# Patient Record
Sex: Female | Born: 1990 | Race: White | Hispanic: No | Marital: Single | Smoking: Current every day smoker
Health system: Southern US, Community
[De-identification: ages and names within clinical notes are randomized; demographics above are authoritative.]

## PROBLEM LIST (undated history)

## (undated) HISTORY — PX: OTHER SURGICAL HISTORY: SHX169

---
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
## 2012-09-11 ENCOUNTER — Encounter (HOSPITAL_COMMUNITY): Payer: Self-pay | Admitting: *Deleted

## 2012-09-11 ENCOUNTER — Emergency Department (HOSPITAL_COMMUNITY): Payer: BC Managed Care – PPO

## 2012-09-11 ENCOUNTER — Emergency Department (HOSPITAL_COMMUNITY)
Admission: EM | Admit: 2012-09-11 | Discharge: 2012-09-12 | Disposition: A | Discharge status: Home / Self Care | Payer: BC Managed Care – PPO | Attending: Emergency Medicine | Admitting: Emergency Medicine

## 2012-09-11 DIAGNOSIS — O039 Complete or unspecified spontaneous abortion without complication: Secondary | ICD-10-CM | POA: Insufficient documentation

## 2012-09-11 DIAGNOSIS — R109 Unspecified abdominal pain: Secondary | ICD-10-CM | POA: Insufficient documentation

## 2012-09-11 DIAGNOSIS — F17200 Nicotine dependence, unspecified, uncomplicated: Secondary | ICD-10-CM | POA: Insufficient documentation

## 2012-09-11 LAB — URINALYSIS, ROUTINE W REFLEX MICROSCOPIC
Glucose, UA: NEGATIVE mg/dL
Ketones, ur: 15 mg/dL — AB
Nitrite: NEGATIVE
Protein, ur: 30 mg/dL — AB
Specific Gravity, Urine: 1.03 (ref 1.005–1.030)
Urobilinogen, UA: 1 mg/dL (ref 0.0–1.0)
pH: 7 (ref 5.0–8.0)

## 2012-09-11 LAB — CBC
HCT: 38.3 % (ref 36.0–46.0)
Hemoglobin: 13.4 g/dL (ref 12.0–15.0)
MCH: 28.3 pg (ref 26.0–34.0)
MCHC: 35 g/dL (ref 30.0–36.0)
MCV: 81 fL (ref 78.0–100.0)
Platelets: 190 10*3/uL (ref 150–400)
RBC: 4.73 MIL/uL (ref 3.87–5.11)
RDW: 14 % (ref 11.5–15.5)
WBC: 14.6 10*3/uL — ABNORMAL HIGH (ref 4.0–10.5)

## 2012-09-11 LAB — ABO/RH
ABO/RH(D): O NEG
Antibody Screen: NEGATIVE

## 2012-09-11 LAB — URINE MICROSCOPIC-ADD ON

## 2012-09-11 LAB — POCT PREGNANCY, URINE: Preg Test, Ur: POSITIVE — AB

## 2012-09-11 LAB — HCG, QUANTITATIVE, PREGNANCY: hCG, Beta Chain, Quant, S: 799 m[IU]/mL — ABNORMAL HIGH (ref <5)

## 2012-09-11 MED ORDER — RHO D IMMUNE GLOBULIN 1500 UNIT/2ML IJ SOLN
300.0000 ug | Freq: Once | INTRAMUSCULAR | Status: AC
  Administered 2012-09-11: 300 ug via INTRAMUSCULAR

## 2012-09-11 NOTE — ED Notes (Signed)
Lab reports pt is [redacted] weeks pregnant, RH neg, O neg.

## 2012-09-11 NOTE — ED Notes (Signed)
The pt has had a pos preg test of approx [redacted] weeks pregnant. She has had vaginal bleeding since yesterday today her flow is heavier.   July 4th lmp

## 2012-09-11 NOTE — ED Notes (Signed)
Mild abd cramps

## 2012-09-12 LAB — RH IG WORKUP (INCLUDES ABO/RH)
ABO/RH(D): O NEG
Antibody Screen: NEGATIVE
Gestational Age(Wks): 6
Unit division: 0
Unit tag comment: 6

## 2012-09-12 NOTE — Discharge Instructions (Signed)
Return to the emergency department if you develop abdominal pain.  Miscarriage A miscarriage is the sudden loss of an unborn baby (fetus) before the 20th week of pregnancy. Most miscarriages happen in the first 3 months of pregnancy. Sometimes, it happens before a woman even knows she is pregnant. A miscarriage is also called a "spontaneous miscarriage" or "early pregnancy loss." Having a miscarriage can be an emotional experience. Talk with your caregiver about any questions you may have about miscarrying, the grieving process, and your future pregnancy plans. CAUSES   Problems with the fetal chromosomes that make it impossible for the baby to develop normally. Problems with the baby's genes or chromosomes are most often the result of errors that occur, by chance, as the embryo divides and grows. The problems are not inherited from the parents.  Infection of the cervix or uterus.   Hormone problems.   Problems with the cervix, such as having an incompetent cervix. This is when the tissue in the cervix is not strong enough to hold the pregnancy.   Problems with the uterus, such as an abnormally shaped uterus, uterine fibroids, or congenital abnormalities.   Certain medical conditions.   Smoking, drinking alcohol, or taking illegal drugs.   Trauma.  Often, the cause of a miscarriage is unknown.  SYMPTOMS   Vaginal bleeding or spotting, with or without cramps or pain.  Pain or cramping in the abdomen or lower back.  Passing fluid, tissue, or blood clots from the vagina. DIAGNOSIS  Your caregiver will perform a physical exam. You may also have an ultrasound to confirm the miscarriage. Blood or urine tests may also be ordered. TREATMENT   Sometimes, treatment is not necessary if you naturally pass all the fetal tissue that was in the uterus. If some of the fetus or placenta remains in the body (incomplete miscarriage), tissue left behind may become infected and must be removed.  Usually, a dilation and curettage (D and C) procedure is performed. During a D and C procedure, the cervix is widened (dilated) and any remaining fetal or placental tissue is gently removed from the uterus.  Antibiotic medicines are prescribed if there is an infection. Other medicines may be given to reduce the size of the uterus (contract) if there is a lot of bleeding.  If you have Rh negative blood and your baby was Rh positive, you will need a Rh immunoglobulin shot. This shot will protect any future baby from having Rh blood problems in future pregnancies. HOME CARE INSTRUCTIONS   Your caregiver may order bed rest or may allow you to continue light activity. Resume activity as directed by your caregiver.  Have someone help with home and family responsibilities during this time.   Keep track of the number of sanitary pads you use each day and how soaked (saturated) they are. Write down this information.   Do not use tampons. Do not douche or have sexual intercourse until approved by your caregiver.   Only take over-the-counter or prescription medicines for pain or discomfort as directed by your caregiver.   Do not take aspirin. Aspirin can cause bleeding.   Keep all follow-up appointments with your caregiver.   If you or your partner have problems with grieving, talk to your caregiver or seek counseling to help cope with the pregnancy loss. Allow enough time to grieve before trying to get pregnant again.  SEEK IMMEDIATE MEDICAL CARE IF:   You have severe cramps or pain in your back or abdomen.  You have a fever.  You pass large blood clots (walnut-sized or larger) ortissue from your vagina. Save any tissue for your caregiver to inspect.   Your bleeding increases.   You have a thick, bad-smelling vaginal discharge.  You become lightheaded, weak, or you faint.   You have chills.  MAKE SURE YOU:  Understand these instructions.  Will watch your condition.  Will  get help right away if you are not doing well or get worse. Document Released: 07/09/2000 Document Revised: 07/15/2011 Document Reviewed: 03/04/2011 [facility] Patient Information 2014 [place], [place].

## 2012-09-12 NOTE — ED Provider Notes (Signed)
CSN: [id number]     Arrival date & time 09/11/12  1847 History     First MD Initiated Contact with Patient 09/11/12 1952     Chief Complaint  Patient presents with  . Vaginal Bleeding   (Consider location/radiation/quality/duration/timing/severity/associated sxs/prior Treatment) Patient is a 22 y.o. female presenting with vaginal bleeding.  Vaginal Bleeding Associated symptoms: abdominal pain   Associated symptoms: no back pain, no dizziness, no dysuria, no fever and no nausea    this is a G2 P62 female whose last menstrual period was July 4 who presents with vaginal bleeding. The patient states that she had a positive home pregnancy test that was confirmed by her doctor. She noted passage of bright red clots today. She's had some lower, cramping. She denies any syncope or dizziness. The patient is a O negative.  History reviewed. No pertinent past medical history. History reviewed. No pertinent past surgical history. No family history on file. History  Substance Use Topics  . Smoking status: Current Every Day Smoker  . Smokeless tobacco: Not on file  . Alcohol Use: No   OB History   Grav Para Term Preterm Abortions TAB SAB Ect Mult Living                 Review of Systems  Constitutional: Negative for fever.  Respiratory: Negative for shortness of breath.   Cardiovascular: Negative for chest pain.  Gastrointestinal: Positive for abdominal pain. Negative for nausea, vomiting and diarrhea.  Genitourinary: Positive for vaginal bleeding. Negative for dysuria.  Musculoskeletal: Negative for back pain.  Neurological: Negative for dizziness.  Psychiatric/Behavioral: Negative for agitation.  All other systems reviewed and are negative.    Allergies  Review of patient's allergies indicates no known allergies.  Home Medications  No current outpatient prescriptions on file. BP 120/67  Pulse 98  Temp(Src) 98.2 F (36.8 C) (Oral)  Resp 16  SpO2 98%  LMP 07/30/2012 Physical  Exam  Nursing note and vitals reviewed. Constitutional: She is oriented to person, place, and time. She appears well-developed and well-nourished.  HENT:  Head: Normocephalic and atraumatic.  Eyes: Pupils are equal, round, and reactive to light.  Neck: Neck supple.  Cardiovascular: Normal rate, regular rhythm and normal heart sounds.   Pulmonary/Chest: Effort normal. No respiratory distress. She has no wheezes.  Abdominal: Soft. Bowel sounds are normal.  Genitourinary:  Blood pooling in the vaginal vault, no clot noted, cervical os is closed.  Neurological: She is alert and oriented to person, place, and time.  Skin: Skin is warm and dry.  Psychiatric: She has a normal mood and affect.    ED Course   Procedures (including critical care time)  Labs Reviewed  CBC - Abnormal; Notable for the following:    WBC 14.6 (*)    All other components within normal limits  URINALYSIS, ROUTINE W REFLEX MICROSCOPIC - Abnormal; Notable for the following:    Color, Urine AMBER (*)    APPearance CLOUDY (*)    Hgb urine dipstick LARGE (*)    Bilirubin Urine SMALL (*)    Ketones, ur 15 (*)    Protein, ur 30 (*)    Leukocytes, UA SMALL (*)    All other components within normal limits  URINE MICROSCOPIC-ADD ON - Abnormal; Notable for the following:    Squamous Epithelial / LPF FEW (*)    All other components within normal limits  HCG, QUANTITATIVE, PREGNANCY - Abnormal; Notable for the following:    hCG, Beta Chain, Quant,  S 799 (*)    All other components within normal limits  POCT PREGNANCY, URINE - Abnormal; Notable for the following:    Preg Test, Ur POSITIVE (*)    All other components within normal limits  ABO/RH  RH IG WORKUP (INCLUDES ABO/RH)   No results found. No diagnosis found. 1.  Bleeding duringearly pregnancy Cannot r/o ectopic pregnancy, completed or threatened misscarriage  MDM  This is a 22 yo who presents with vaginal bleeding in the setting of early pregnancy.   Patient is nontoxic.  Labwork including type and screen were obtained.  Patient does not have any lateralizing abdominal pain and pelvic is notable for pool blood and a closed os.  Patient is O- and will need rhogam.  b HCG is 799 and ultrasound shows no gestational sac.  THis could represent an ectopic pregnancy, a completed miscarriage, or early pregnancy.  Patient will need a repeat b hcg and ultrasound in two weeks.  She was given strict return precautions if she has worsening abdominal pain, worsening bleeding, lightheadedness.  Shon Baton, MD 09/12/12 [phone]

## 2012-09-12 NOTE — ED Provider Notes (Signed)
Patient initially seen and examined by Dr. Wilkie Aye and sent for ultrasound for suspected completed moderate miscarriage. Ultrasound shows no evidence of any intrauterine pregnancy but no free fluid to suggest ruptured ectopic pregnancy. She most likely has had a completed miscarriage. She is discharged and referred to GYN outpatient clinic for followup.  Dione Booze, MD 09/12/12 0230

## 2014-04-08 IMAGING — US US OB TRANSVAGINAL
1 series · 13 of 28 positions shown · non-contrast
Comparison: None.

CLINICAL DATA: 21-year-old female with vaginal bleeding.  Positive
pregnancy test.  Quantitative beta HCG 799.  Estimated gestational
age by LMP 6 weeks 2 days.

OBSTETRIC <14 WK US AND TRANSVAGINAL OB US
TECHNIQUE: Both transabdominal and transvaginal ultrasound
examinations were performed for complete evaluation of the
gestation as well as the maternal uterus, adnexal regions, and
pelvic cul-de-sac.  Transvaginal technique was performed to assess
early pregnancy.

[Series 1: us ob transvaginal · 0.26mm/px · 13 of 53 slices shown]
[im 2/53]
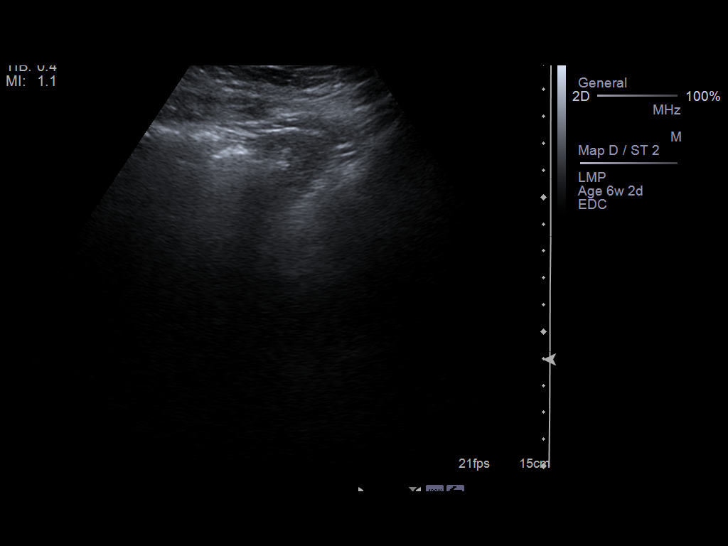
[im 6/53]
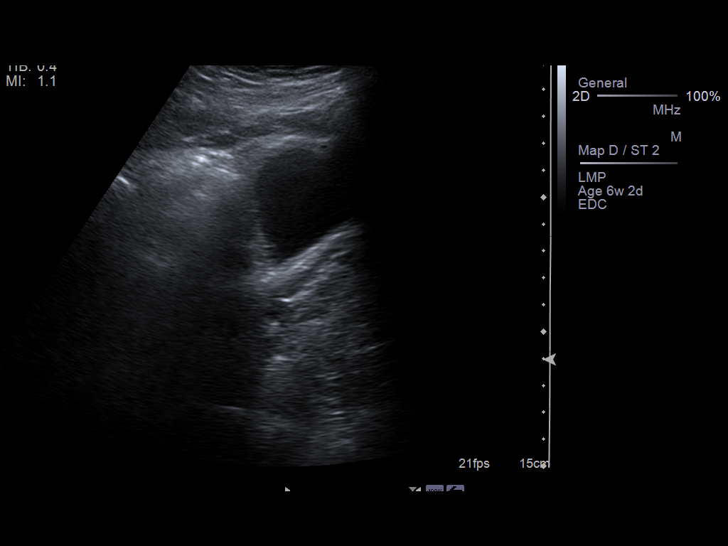
[im 10/53]
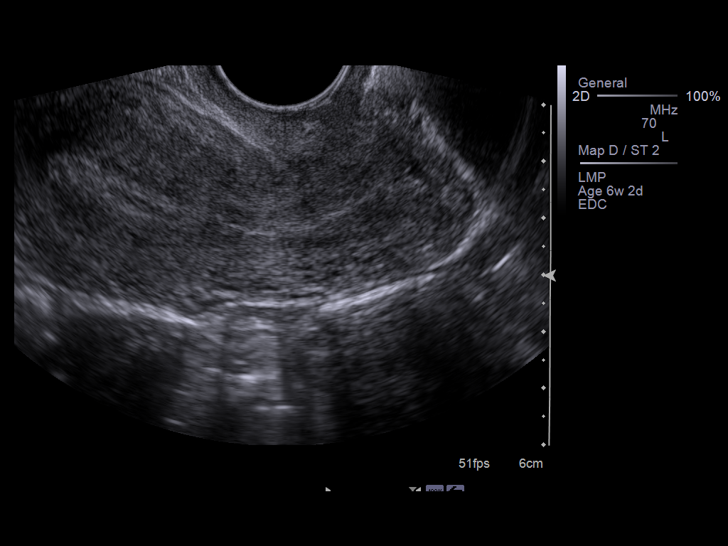
[im 14/53]
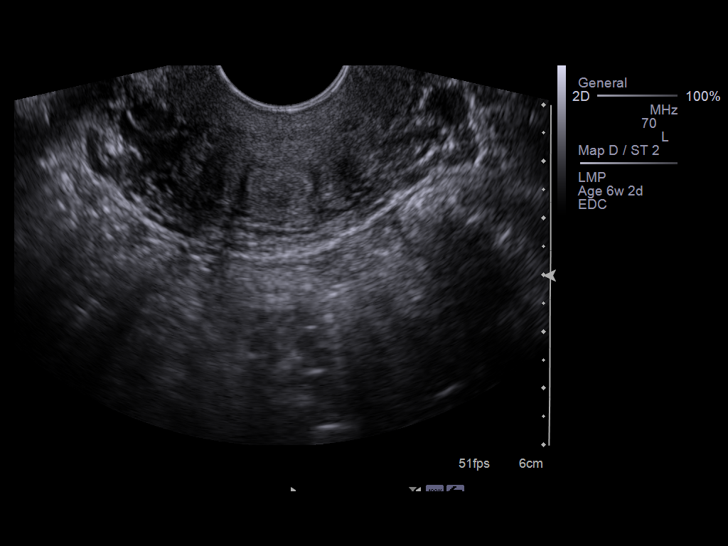
[im 18/53]
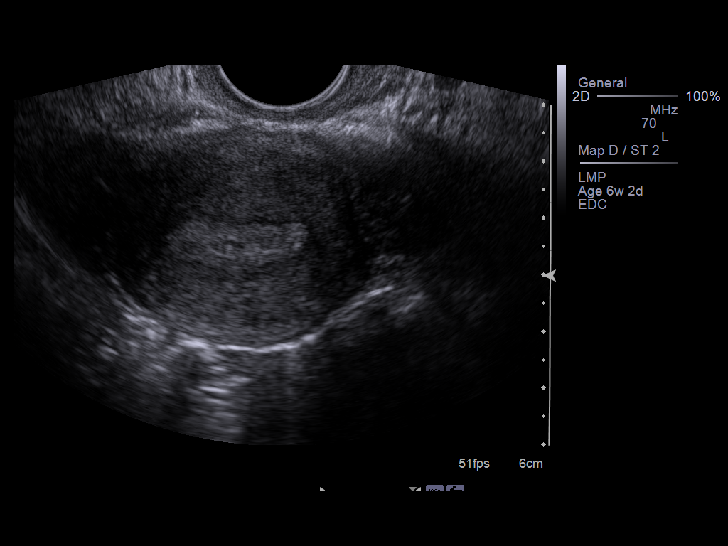
[im 22/53]
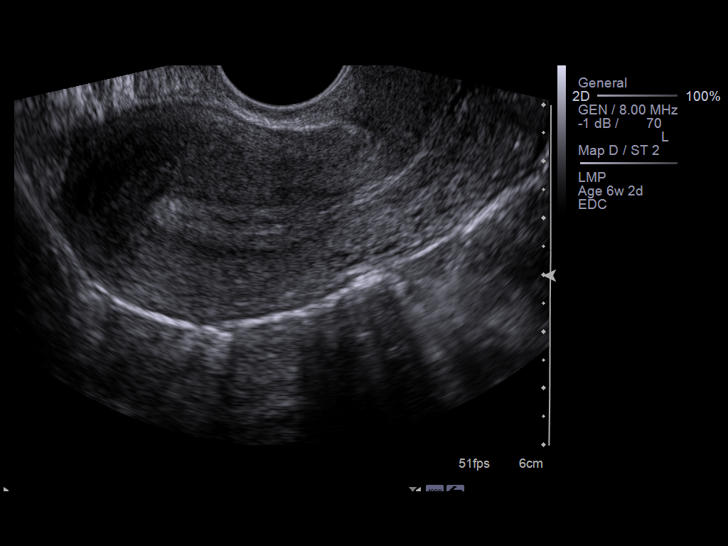
[im 27/53]
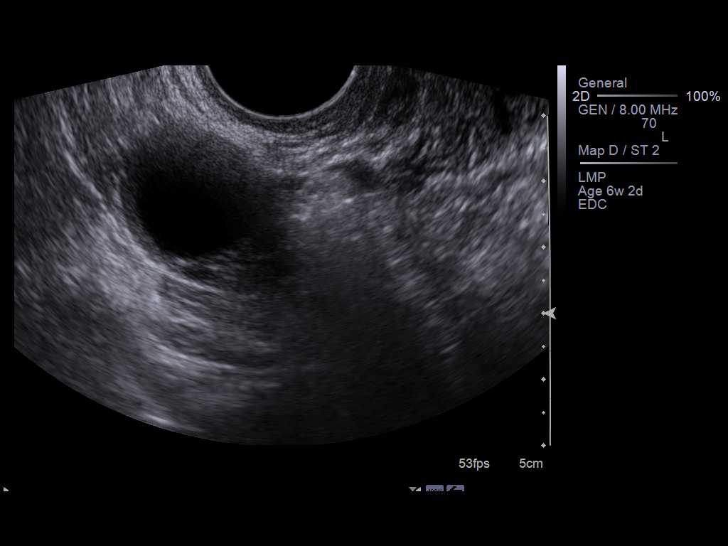
[im 31/53]
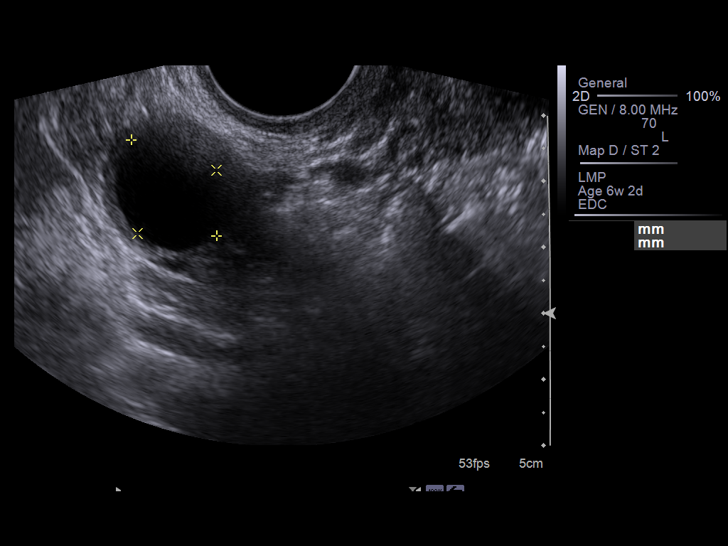
[im 35/53]
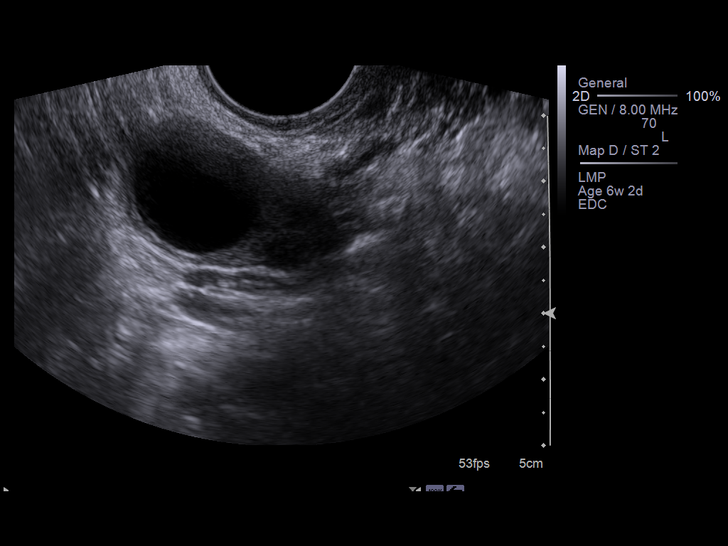
[im 39/53]
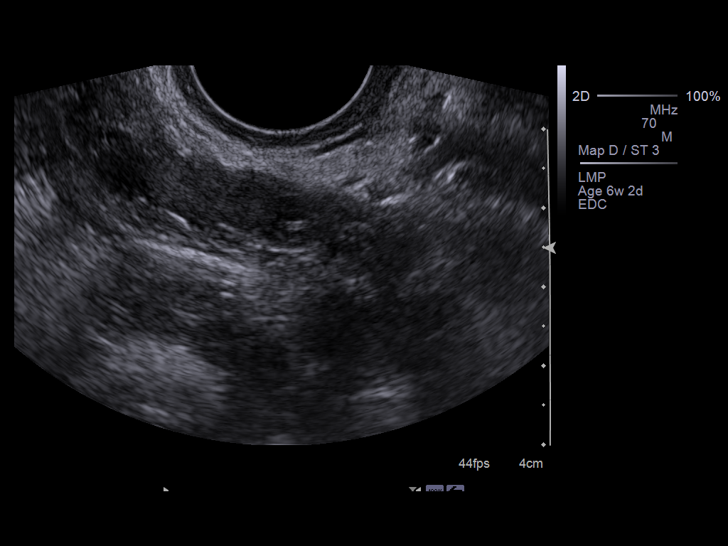
[im 43/53]
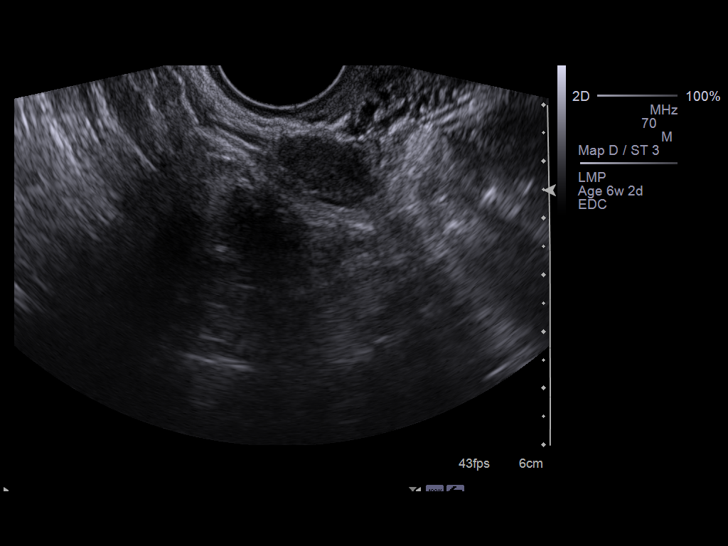
[im 47/53]
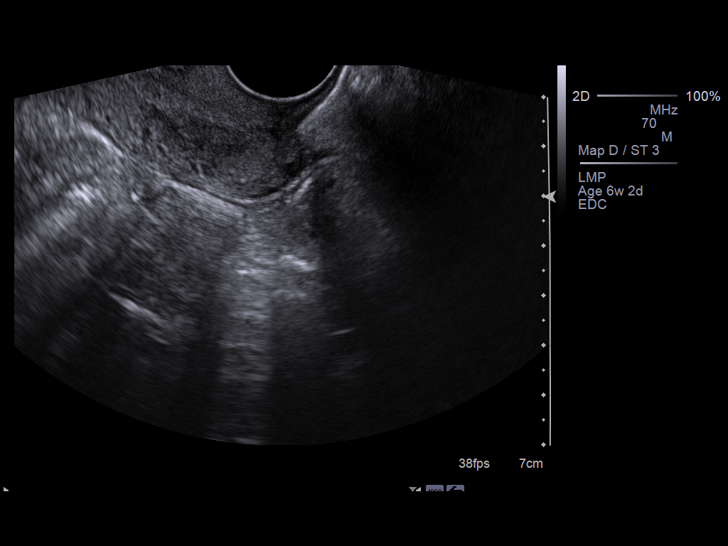
[im 51/53]
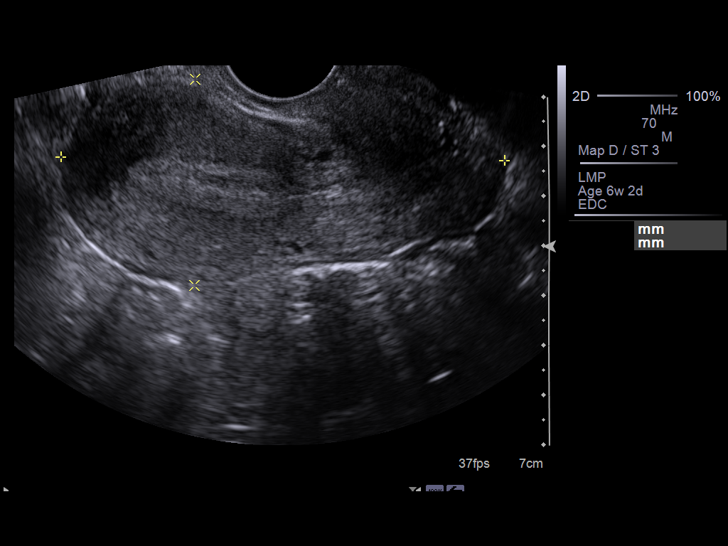

[13 of 28 positions shown; findings below may reference images not displayed]

Intrauterine gestational sac:  None.

Maternal uterus/adnexae:
Bland uterus and endometrium.

Right ovary measures 3.2 x 2.2 x 2.8 cm and contains a simple
appearing cystic structure measuring 19 mm diameter.  No internal
architecture or hypervascularity (image 35).  Normal left ovary
measuring 20 x 19 x 9 mm.

No pelvic free fluid.
IMPRESSION: No intrauterine gestational sac, yolk sac, fetal pole, or cardiac
activity visualized.  Simple appearing right adnexal cyst.  No
pelvic free fluid or suspicious adnexal mass.
Differential considerations include intrauterine gestation too
early to be sonographically visualized, spontaneous abortion, or
ectopic pregnancy.  Consider follow-up ultrasound in 14 days and
serial quantitative beta HCG follow-up.

## 2018-06-24 ENCOUNTER — Inpatient Hospital Stay (HOSPITAL_COMMUNITY): Payer: Medicaid Other

## 2018-06-24 ENCOUNTER — Inpatient Hospital Stay (HOSPITAL_COMMUNITY)
Admission: AD | Admit: 2018-06-24 | Discharge: 2018-06-24 | Disposition: A | Discharge status: Home / Self Care | Payer: Medicaid Other | Attending: Obstetrics and Gynecology | Admitting: Obstetrics and Gynecology

## 2018-06-24 ENCOUNTER — Encounter (HOSPITAL_COMMUNITY): Payer: Self-pay | Admitting: *Deleted

## 2018-06-24 ENCOUNTER — Other Ambulatory Visit: Payer: Self-pay

## 2018-06-24 DIAGNOSIS — O23591 Infection of other part of genital tract in pregnancy, first trimester: Secondary | ICD-10-CM | POA: Diagnosis not present

## 2018-06-24 DIAGNOSIS — N760 Acute vaginitis: Secondary | ICD-10-CM | POA: Diagnosis not present

## 2018-06-24 DIAGNOSIS — F17200 Nicotine dependence, unspecified, uncomplicated: Secondary | ICD-10-CM | POA: Diagnosis not present

## 2018-06-24 DIAGNOSIS — O209 Hemorrhage in early pregnancy, unspecified: Secondary | ICD-10-CM | POA: Insufficient documentation

## 2018-06-24 DIAGNOSIS — Z3A10 10 weeks gestation of pregnancy: Secondary | ICD-10-CM | POA: Insufficient documentation

## 2018-06-24 DIAGNOSIS — O4691 Antepartum hemorrhage, unspecified, first trimester: Secondary | ICD-10-CM

## 2018-06-24 DIAGNOSIS — O99331 Smoking (tobacco) complicating pregnancy, first trimester: Secondary | ICD-10-CM | POA: Insufficient documentation

## 2018-06-24 DIAGNOSIS — O4690 Antepartum hemorrhage, unspecified, unspecified trimester: Secondary | ICD-10-CM

## 2018-06-24 DIAGNOSIS — B9689 Other specified bacterial agents as the cause of diseases classified elsewhere: Secondary | ICD-10-CM | POA: Insufficient documentation

## 2018-06-24 DIAGNOSIS — N939 Abnormal uterine and vaginal bleeding, unspecified: Secondary | ICD-10-CM | POA: Diagnosis present

## 2018-06-24 LAB — WET PREP, GENITAL
Sperm: NONE SEEN
Trich, Wet Prep: NONE SEEN
Yeast Wet Prep HPF POC: NONE SEEN

## 2018-06-24 LAB — URINALYSIS, ROUTINE W REFLEX MICROSCOPIC
Bacteria, UA: NONE SEEN
Bilirubin Urine: NEGATIVE
Glucose, UA: NEGATIVE mg/dL
Ketones, ur: NEGATIVE mg/dL
Leukocytes,Ua: NEGATIVE
Nitrite: NEGATIVE
Protein, ur: NEGATIVE mg/dL
Specific Gravity, Urine: 1.008 (ref 1.005–1.030)
pH: 6 (ref 5.0–8.0)

## 2018-06-24 LAB — CBC
HCT: 34.6 % — ABNORMAL LOW (ref 36.0–46.0)
Hemoglobin: 10.7 g/dL — ABNORMAL LOW (ref 12.0–15.0)
MCH: 23.9 pg — ABNORMAL LOW (ref 26.0–34.0)
MCHC: 30.9 g/dL (ref 30.0–36.0)
MCV: 77.2 fL — ABNORMAL LOW (ref 80.0–100.0)
Platelets: 223 10*3/uL (ref 150–400)
RBC: 4.48 MIL/uL (ref 3.87–5.11)
RDW: 17.1 % — ABNORMAL HIGH (ref 11.5–15.5)
WBC: 12.3 10*3/uL — ABNORMAL HIGH (ref 4.0–10.5)
nRBC: 0 % (ref 0.0–0.2)

## 2018-06-24 LAB — HCG, QUANTITATIVE, PREGNANCY: hCG, Beta Chain, Quant, S: 1356 m[IU]/mL — ABNORMAL HIGH (ref <5)

## 2018-06-24 LAB — POCT PREGNANCY, URINE: Preg Test, Ur: POSITIVE — AB

## 2018-06-24 MED ORDER — METRONIDAZOLE 500 MG PO TABS: 500.0000 mg | ORAL_TABLET | Freq: Two times a day (BID) | ORAL | 0 refills | Status: AC

## 2018-06-24 MED ORDER — RHO D IMMUNE GLOBULIN 1500 UNIT/2ML IJ SOSY
300.0000 ug | PREFILLED_SYRINGE | Freq: Once | INTRAMUSCULAR | Status: AC
  Administered 2018-06-24: 300 ug via INTRAMUSCULAR
  Filled 2018-06-24: qty 2

## 2018-06-24 NOTE — MAU Note (Signed)
Maria English is a 28 y.o. at Unknown here in MAU reporting: vaginal bleeding that started this morning. Last intercourse 3 weeks ago.  LMP: 04/10/18 Onset of complaint: early AM Pain score: 0 Vitals:   06/24/18 1643  BP: 123/78  Pulse: (!) 103  Resp: 16  Temp: 98.4 F (36.9 C)  SpO2: 100%      Lab orders placed from triage: UA

## 2018-06-24 NOTE — MAU Provider Note (Signed)
History     CSN: [id number]  Arrival date and time: 06/24/18 1617   First Provider Initiated Contact with Patient 06/24/18 1705      Chief Complaint  Patient presents with  . Vaginal Bleeding   Maria English is a 28 y.o. [id number] at [redacted]w[redacted]d by Posey Rea LMP who receives care at [facility].  She presents today for Vaginal Bleeding that started this morning.  She denies passing of clots or abdominal cramping.  Patient states bleeding is similar to first day of period and the color is "really pink."  Patient denies need for pad usage, but does wear a pantyliner.  She endorses blood on arrival.  Patient endorses vaginal discharge that is "clear white," but denies itching, burning, or other concerns.      OB History    Gravida  9   Para  3   Term  3   Preterm      AB  5   Living  3     SAB  5   TAB      Ectopic      Multiple      Live Births  3           History reviewed. No pertinent past medical history.  Past Surgical History:  Procedure Laterality Date  . feet surgery      History reviewed. No pertinent family history.  Social History   Tobacco Use  . Smoking status: Current Every Day Smoker  . Smokeless tobacco: Current User  Substance Use Topics  . Alcohol use: No  . Drug use: Never    Allergies: No Known Allergies  Medications Prior to Admission  Medication Sig Dispense Refill Last Dose  . Prenatal Vit-Fe Fumarate-FA (PRENATAL MULTIVITAMIN) TABS tablet Take 1 tablet by mouth daily at 12 noon.   06/24/2018 at Unknown time    Review of Systems  Constitutional: Negative for chills and fever.  Gastrointestinal: Negative for abdominal pain, constipation, diarrhea, nausea and vomiting.  Genitourinary: Positive for vaginal bleeding and vaginal discharge. Negative for difficulty urinating, dysuria and pelvic pain.  Musculoskeletal: Positive for back pain.  Neurological: Negative for dizziness, light-headedness and headaches.   Physical Exam    Blood pressure 123/78, pulse (!) 103, temperature 98.4 F (36.9 C), resp. rate 16, last menstrual period 04/10/2018, SpO2 100 %.  Physical Exam  Constitutional: She is oriented to person, place, and time. She appears well-developed and well-nourished. No distress.  HENT:  Head: Normocephalic and atraumatic.  Eyes: Conjunctivae are normal.  Neck: Normal range of motion.  Cardiovascular: Normal rate.  Respiratory: Effort normal.  GI: Soft.  Genitourinary: Cervix exhibits discharge. Cervix exhibits no motion tenderness and no friability.    No vaginal discharge or bleeding.  No bleeding in the vagina.    Genitourinary Comments: Speculum Exam: -Vaginal Vault: Pink mucosa.  No apparent discharge. Wet prep collected from posterior fornix -Cervix: Pink, no lesions, cysts, or polyps.  Appears closed. Small amt active bleeding from os-Removed with 2 faux swabs.  GC/CT collected -Bimanual Exam: Closed/No Tenderness Uterus not c/w dates of 10 weeks.    Musculoskeletal: Normal range of motion.  Neurological: She is alert and oriented to person, place, and time.  Skin: Skin is warm and dry.  Psychiatric: She has a normal mood and affect. Her behavior is normal.    MAU Course  Procedures Results for orders placed or performed during the hospital encounter of 06/24/18 (from the past 24 hour(s))  Pregnancy, urine  POC     Status: Abnormal   Collection Time: 06/24/18  4:43 PM  Result Value Ref Range   Preg Test, Ur POSITIVE (A) NEGATIVE  Urinalysis, Routine w reflex microscopic     Status: Abnormal   Collection Time: 06/24/18  5:00 PM  Result Value Ref Range   Color, Urine YELLOW YELLOW   APPearance HAZY (A) CLEAR   Specific Gravity, Urine 1.008 1.005 - 1.030   pH 6.0 5.0 - 8.0   Glucose, UA NEGATIVE NEGATIVE mg/dL   Hgb urine dipstick MODERATE (A) NEGATIVE   Bilirubin Urine NEGATIVE NEGATIVE   Ketones, ur NEGATIVE NEGATIVE mg/dL   Protein, ur NEGATIVE NEGATIVE mg/dL   Nitrite NEGATIVE  NEGATIVE   Leukocytes,Ua NEGATIVE NEGATIVE   RBC / HPF 6-10 0 - 5 RBC/hpf   WBC, UA 0-5 0 - 5 WBC/hpf   Bacteria, UA NONE SEEN NONE SEEN   Squamous Epithelial / LPF 0-5 0 - 5   Mucus PRESENT   Wet prep, genital     Status: Abnormal   Collection Time: 06/24/18  5:12 PM  Result Value Ref Range   Yeast Wet Prep HPF POC NONE SEEN NONE SEEN   Trich, Wet Prep NONE SEEN NONE SEEN   Clue Cells Wet Prep HPF POC PRESENT (A) NONE SEEN   WBC, Wet Prep HPF POC MANY (A) NONE SEEN   Sperm NONE SEEN   Rh IG workup (includes ABO/Rh)     Status: None (Preliminary result)   Collection Time: 06/24/18  5:28 PM  Result Value Ref Range   Gestational Age(Wks) 10.5    ABO/RH(D) O NEG    Antibody Screen NEG    Unit Number [id number]    Blood Component Type RHIG    Unit division 00    Status of Unit ALLOCATED    Transfusion Status      OK TO TRANSFUSE Performed at [facility], [address] [place], [place] [address]   CBC     Status: Abnormal   Collection Time: 06/24/18  5:28 PM  Result Value Ref Range   WBC 12.3 (H) 4.0 - 10.5 K/uL   RBC 4.48 3.87 - 5.11 MIL/uL   Hemoglobin 10.7 (L) 12.0 - 15.0 g/dL   HCT 81.0 (L) 17.5 - 10.2 %   MCV 77.2 (L) 80.0 - 100.0 fL   MCH 23.9 (L) 26.0 - 34.0 pg   MCHC 30.9 30.0 - 36.0 g/dL   RDW 58.5 (H) 27.7 - 82.4 %   Platelets 223 150 - 400 K/uL   nRBC 0.0 0.0 - 0.2 %   US Ob Comp Less 14 Wks  Result Date: 06/24/2018 CLINICAL DATA:  Pregnant patient with vaginal bleeding. EXAM: OBSTETRIC <14 WK [place] AND TRANSVAGINAL OB US TECHNIQUE: Both transabdominal and transvaginal ultrasound examinations were performed for complete evaluation of the gestation as well as the maternal uterus, adnexal regions, and pelvic cul-de-sac. Transvaginal technique was performed to assess early pregnancy. COMPARISON:  None. FINDINGS: Intrauterine gestational sac: Single visualized. Yolk sac:  Not visualized. Embryo:  Not visualized. Cardiac Activity: Not applicable. MSD: 5.1 mm   5  w   2 d Subchorionic hemorrhage:  None visualized. Maternal uterus/adnexae: Unremarkable. IMPRESSION: Probable early intrauterine gestational sac, but no yolk sac, fetal pole, or cardiac activity yet visualized. Recommend follow-up quantitative B-HCG levels and follow-up US in 14 days to assess viability. This recommendation follows SRU consensus guidelines: Diagnostic Criteria for Nonviable Pregnancy Early in the First Trimester. N Engl J  Med 2013; 161:0960-45369:1443-51. Electronically Signed   By: Drusilla Kannerhomas  Dalessio M.D.   On: 06/24/2018 18:15   [place] Ob Transvaginal  Result Date: 06/24/2018 CLINICAL DATA:  Pregnant patient with vaginal bleeding. EXAM: OBSTETRIC <14 WK US AND TRANSVAGINAL OB US TECHNIQUE: Both transabdominal and transvaginal ultrasound examinations were performed for complete evaluation of the gestation as well as the maternal uterus, adnexal regions, and pelvic cul-de-sac. Transvaginal technique was performed to assess early pregnancy. COMPARISON:  None. FINDINGS: Intrauterine gestational sac: Single visualized. Yolk sac:  Not visualized. Embryo:  Not visualized. Cardiac Activity: Not applicable. MSD: 5.1 mm   5 w   2 d Subchorionic hemorrhage:  None visualized. Maternal uterus/adnexae: Unremarkable. IMPRESSION: Probable early intrauterine gestational sac, but no yolk sac, fetal pole, or cardiac activity yet visualized. Recommend follow-up quantitative B-HCG levels and follow-up US in 14 days to assess viability. This recommendation follows SRU consensus guidelines: Diagnostic Criteria for Nonviable Pregnancy Early in the First Trimester. Malva Limes Engl J Med 2013; 409:8119-14; 369:1443-51. Electronically Signed   By: Drusilla Kannerhomas  Dalessio M.D.   On: 06/24/2018 18:15     MDM Pelvic Exam with cultures Labs: UA, Rhogam WU, Wet prep, and GC/CT TVUS Rhogam Assessment and Plan  28 year old [id number] 10.5 weeks by Unsure LMP Vaginal Bleeding Rh Negative  -Exam findings discussed.  -Informed of need for Rhogam. -Cultures  collected and sent. -Will send for US. -Labs ordered.   Follow Up (6:46 PM) Vaginal Bleeding Bacterial Vaginosis  -US results return with no embryo or yolk sac, hCG pending. -Wet prep returns with significant for clue cells. -Results discussed with patient. -Informed that dates not c/w findings and could possibly be inadequate dating vs anembryonic pregnancy (miscarriage). -Bleeding precautions given. -Recommendation for follow up in 10-14 weeks with US.  -Informed that if quant results indicative of repeat or serial testing, provider will call to schedule accordingly. -Patient given oral Flagyl for BV treatment.  Rx sent to [facility] on [address] per patient request. -Patient without questions or concerns. -US requests placed per protocol. -Encouraged to call or return to MAU if symptoms worsen or with the onset of new symptoms. -Discharged to home in stable condition  Cherre RobinsJessica L Jayln Branscom MSN, CNM 06/24/2018, 5:05 PM

## 2018-06-24 NOTE — Discharge Instructions (Signed)
Miscarriage A miscarriage is the loss of an unborn baby (fetus) before the 20th week of pregnancy. Most miscarriages happen during the first 3 months of pregnancy. Sometimes, a miscarriage can happen before a woman knows that she is pregnant. Having a miscarriage can be an emotional experience. If you have had a miscarriage, talk with your health care provider about any questions you may have about miscarrying, the grieving process, and your plans for future pregnancy. What are the causes? A miscarriage may be caused by:  Problems with the genes or chromosomes of the fetus. These problems make it impossible for the baby to develop normally. They are often the result of random errors that occur early in the development of the baby, and are not passed from parent to child (not inherited).  Infection of the cervix or uterus.  Conditions that affect hormone balance in the body.  Problems with the cervix, such as the cervix opening and thinning before pregnancy is at term (cervical insufficiency).  Problems with the uterus. These may include: ? A uterus with an abnormal shape. ? Fibroids in the uterus. ? Congenital abnormalities. These are problems that were present at birth.  Certain medical conditions.  Smoking, drinking alcohol, or using drugs.  Injury (trauma). In many cases, the cause of a miscarriage is not known. What are the signs or symptoms? Symptoms of this condition include:  Vaginal bleeding or spotting, with or without cramps or pain.  Pain or cramping in the abdomen or lower back.  Passing fluid, tissue, or blood clots from the vagina. How is this diagnosed? This condition may be diagnosed based on:  A physical exam.  Ultrasound.  Blood tests.  Urine tests. How is this treated? Treatment for a miscarriage is sometimes not necessary if you naturally pass all the tissue that was in your uterus. If necessary, this condition may be treated with:  Dilation and  curettage (D&C). This is a procedure in which the cervix is stretched open and the lining of the uterus (endometrium) is scraped. This is done only if tissue from the fetus or placenta remains in the body (incomplete miscarriage).  Medicines, such as: ? Antibiotic medicine, to treat infection. ? Medicine to help the body pass any remaining tissue. ? Medicine to reduce (contract) the size of the uterus. These medicines may be given if you have a lot of bleeding. If you have Rh negative blood and your baby was Rh positive, you will need a shot of a medicine called Rh immunoglobulinto protect your future babies from Rh blood problems. "Rh-negative" and "Rh-positive" refer to whether or not the blood has a specific protein found on the surface of red blood cells (Rh factor). Follow these instructions at home: Medicines   Bacterial Vaginosis  Bacterial vaginosis is a vaginal infection that occurs when the normal balance of bacteria in the vagina is disrupted. It results from an overgrowth of certain bacteria. This is the most common vaginal infection among women ages 48-44. Because bacterial vaginosis increases your risk for STIs (sexually transmitted infections), getting treated can help reduce your risk for chlamydia, gonorrhea, herpes, and HIV (human immunodeficiency virus). Treatment is also important for preventing complications in pregnant women, because this condition can cause an early (premature) delivery. What are the causes? This condition is caused by an increase in harmful bacteria that are normally present in small amounts in the vagina. However, the reason that the condition develops is not fully understood. What increases the risk? The following factors  may make you more likely to develop this condition:  Having a new sexual partner or multiple sexual partners.  Having unprotected sex.  Douching.  Having an intrauterine device (IUD).  Smoking.  Drug and alcohol  abuse.  Taking certain antibiotic medicines.  Being pregnant. You cannot get bacterial vaginosis from toilet seats, bedding, swimming pools, or contact with objects around you. What are the signs or symptoms? Symptoms of this condition include:  Grey or white vaginal discharge. The discharge can also be watery or foamy.  A fish-like odor with discharge, especially after sexual intercourse or during menstruation.  Itching in and around the vagina.  Burning or pain with urination. Some women with bacterial vaginosis have no signs or symptoms. How is this diagnosed? This condition is diagnosed based on:  Your medical history.  A physical exam of the vagina.  Testing a sample of vaginal fluid under a microscope to look for a large amount of bad bacteria or abnormal cells. Your health care provider may use a cotton swab or a small wooden spatula to collect the sample. How is this treated? This condition is treated with antibiotics. These may be given as a pill, a vaginal cream, or a medicine that is put into the vagina (suppository). If the condition comes back after treatment, a second round of antibiotics may be needed. Follow these instructions at home: Medicines  Take over-the-counter and prescription medicines only as told by your health care provider.  Take or use your antibiotic as told by your health care provider. Do not stop taking or using the antibiotic even if you start to feel better. General instructions  If you have a female sexual partner, tell her that you have a vaginal infection. She should see her health care provider and be treated if she has symptoms. If you have a female sexual partner, he does not need treatment.  During treatment: ? Avoid sexual activity until you finish treatment. ? Do not douche. ? Avoid alcohol as directed by your health care provider. ? Avoid breastfeeding as directed by your health care provider.  Drink enough water and fluids to  keep your urine clear or pale yellow.  Keep the area around your vagina and rectum clean. ? Wash the area daily with warm water. ? Wipe yourself from front to back after using the toilet.  Keep all follow-up visits as told by your health care provider. This is important. How is this prevented?  Do not douche.  Wash the outside of your vagina with warm water only.  Use protection when having sex. This includes latex condoms and dental dams.  Limit how many sexual partners you have. To help prevent bacterial vaginosis, it is best to have sex with just one partner (monogamous).  Make sure you and your sexual partner are tested for STIs.  Wear cotton or cotton-lined underwear.  Avoid wearing tight pants and pantyhose, especially during summer.  Limit the amount of alcohol that you drink.  Do not use any products that contain nicotine or tobacco, such as cigarettes and e-cigarettes. If you need help quitting, ask your health care provider.  Do not use illegal drugs. Where to find more information  Centers for Disease Control and Prevention: SolutionApps.co.za  American Sexual Health Association (ASHA): www.ashastd.org  U.S. Department of Health and Health and safety inspector, Office on Women's Health: ConventionalMedicines.si or http://www.anderson-williamson.info/ Contact a health care provider if:  Your symptoms do not improve, even after treatment.  You have more discharge or pain when  urinating.  You have a fever.  You have pain in your abdomen.  You have pain during sex.  You have vaginal bleeding between periods. Summary  Bacterial vaginosis is a vaginal infection that occurs when the normal balance of bacteria in the vagina is disrupted.  Because bacterial vaginosis increases your risk for STIs (sexually transmitted infections), getting treated can help reduce your risk for chlamydia, gonorrhea, herpes, and HIV (human immunodeficiency virus). Treatment is  also important for preventing complications in pregnant women, because the condition can cause an early (premature) delivery.  This condition is treated with antibiotic medicines. These may be given as a pill, a vaginal cream, or a medicine that is put into the vagina (suppository). This information is not intended to replace advice given to you by your health care provider. Make sure you discuss any questions you have with your health care provider. Document Released: 01/13/2005 Document Revised: 05/19/2016 Document Reviewed: 09/29/2015 Elsevier Interactive Patient Education  2019 [employer]   Take over-the-counter and prescription medicines only as told by your health care provider.  If you were prescribed antibiotic medicine, take it as told by your health care provider. Do not stop taking the antibiotic even if you start to feel better.  Do not take NSAIDs, such as aspirin and ibuprofen, unless they are approved by your health care provider. These medicines can cause bleeding. Activity  Rest as directed. Ask your health care provider what activities are safe for you.  Have someone help with home and family responsibilities during this time. General instructions  Keep track of the number of sanitary pads you use each day and how soaked (saturated) they are. Write down this information.  Monitor the amount of tissue or blood clots that you pass from your vagina. Save any large amounts of tissue for your health care provider to examine.  Do not use tampons, douche, or have sex until your health care provider approves.  To help you and your partner with the process of grieving, talk with your health care provider or seek counseling.  When you are ready, meet with your health care provider to discuss any important steps you should take for your health. Also, discuss steps you should take to have a healthy pregnancy in the future.  Keep all follow-up visits as told by your health care  provider. This is important. Where to find more information  The American Congress of Obstetricians and Gynecologists: www.acog.org  U.S. Department of Health and CytogeneticistHuman Services Office of Womens Health: http://hoffman.com/www.womenshealth.gov Contact a health care provider if:  You have a fever or chills.  You have a foul smelling vaginal discharge.  You have more bleeding instead of less. Get help right away if:  You have severe cramps or pain in your back or abdomen.  You pass blood clots or tissue from your vagina that is walnut-sized or larger.  You soak more than 1 regular sanitary pad in an hour.  You become light-headed or weak.  You pass out.  You have feelings of sadness that take over your thoughts, or you have thoughts of hurting yourself. Summary  Most miscarriages happen in the first 3 months of pregnancy. Sometimes miscarriage happens before a woman even knows that she is pregnant.  Follow your health care provider's instruction for home care. Keep all follow-up appointments.  To help you and your partner with the process of grieving, talk with your health care provider or seek counseling. This information is not intended to replace  advice given to you by your health care provider. Make sure you discuss any questions you have with your health care provider. Document Released: 07/09/2000 Document Revised: 02/19/2016 Document Reviewed: 02/19/2016 Elsevier Interactive Patient Education  2019 [employer].

## 2018-06-25 ENCOUNTER — Other Ambulatory Visit (HOSPITAL_COMMUNITY): Payer: Self-pay

## 2018-06-25 ENCOUNTER — Telehealth: Payer: Self-pay

## 2018-06-25 DIAGNOSIS — O4690 Antepartum hemorrhage, unspecified, unspecified trimester: Secondary | ICD-10-CM

## 2018-06-25 LAB — GC/CHLAMYDIA PROBE AMP (~~LOC~~) NOT AT ARMC
Chlamydia: NEGATIVE
Neisseria Gonorrhea: NEGATIVE

## 2018-06-25 LAB — RH IG WORKUP (INCLUDES ABO/RH)
ABO/RH(D): O NEG
Antibody Screen: NEGATIVE
Gestational Age(Wks): 10.5
Unit division: 0

## 2018-06-25 NOTE — Telephone Encounter (Signed)
[id number] Maria English 02/21/1990 [id number]   Patient called and verified her identity via birth date and last 4 of her SSN.  Patient agreeable to results via phone and was informed of Quant hcG levels.  Recommendation for follow up quant in MAU on Saturday Jun 25, 2018 after 1700.  Patient questions addressed and no further concerns expressed.  Order placed for future lab order.  Cherre Robins MSN, CNM 06/25/2018 8:50 AM   **This visit was completed, in its entirety, via telehealth communications.  I personally spent 2 minutes on the phone providing recommendations, education, and guidance.**

## 2018-06-26 ENCOUNTER — Other Ambulatory Visit: Payer: Self-pay

## 2018-06-26 ENCOUNTER — Inpatient Hospital Stay (HOSPITAL_COMMUNITY)
Admission: AD | Admit: 2018-06-26 | Discharge: 2018-06-26 | Disposition: A | Discharge status: Home / Self Care | Payer: Medicaid Other | Attending: Obstetrics and Gynecology | Admitting: Obstetrics and Gynecology

## 2018-06-26 DIAGNOSIS — O3680X0 Pregnancy with inconclusive fetal viability, not applicable or unspecified: Secondary | ICD-10-CM | POA: Diagnosis present

## 2018-06-26 DIAGNOSIS — Z3A11 11 weeks gestation of pregnancy: Secondary | ICD-10-CM | POA: Diagnosis not present

## 2018-06-26 LAB — HCG, QUANTITATIVE, PREGNANCY: hCG, Beta Chain, Quant, S: 794 m[IU]/mL — ABNORMAL HIGH (ref <5)

## 2018-06-26 NOTE — MAU Provider Note (Signed)
Ms. Maria English  is a 28 y.o. [id number]  at [redacted]w[redacted]d who presents to MAU today for follow-up quant hCG. The patient denies abdominal pain, vaginal bleeding, N/V or fever.   BP 120/66 (BP Location: Right Arm)   Pulse (!) 105   Temp 98.1 F (36.7 C) (Oral)   Resp 18   LMP 04/10/2018   SpO2 100%   GENERAL: Well-developed, well-nourished female in no acute distress.  HEENT: Normocephalic, atraumatic.   LUNGS: Effort normal HEART: Regular rate  SKIN: Warm, dry and without erythema PSYCH: Normal mood and affect   A: 1. Pregnancy of unknown anatomic location   -<50% drop in HCG Lab Results  Component Value Date   HCGBETAQNT 794 (H) 06/26/2018   HCGBETAQNT 1,356 (H) 06/24/2018     P: Discharge home Likely miscarriage but can't exclude ectopic pregnancy as drop in HCG is less than 50% Pt scheduled for f/u HCG in office on Tuesday SAB vs ectopic precautions   Judeth Horn, NP  06/26/2018 5:04 PM

## 2018-06-26 NOTE — MAU Note (Signed)
Pt left after blood draw per provider, will leave pt on the board to follow results

## 2018-06-26 NOTE — MAU Note (Signed)
Maria English is a 28 y.o. at [redacted]w[redacted]d here in MAU reporting: here for follow up HCG, states she is still having some pink bloody mucus. It is a little bit more then when she was here last. No pain.  Pain score: 0/10  Vitals:   06/26/18 1701  BP: 120/66  Pulse: (!) 105  Resp: 18  Temp: 98.1 F (36.7 C)  SpO2: 100%      Lab orders placed from triage: hcg

## 2018-06-29 ENCOUNTER — Other Ambulatory Visit: Payer: Medicaid Other

## 2018-06-29 ENCOUNTER — Other Ambulatory Visit: Payer: Self-pay

## 2018-06-29 DIAGNOSIS — O3680X0 Pregnancy with inconclusive fetal viability, not applicable or unspecified: Secondary | ICD-10-CM

## 2018-06-29 LAB — BETA HCG QUANT (REF LAB): hCG Quant: 63 m[IU]/mL

## 2018-06-29 NOTE — Progress Notes (Unsigned)
Pt here today STAT Beta Lab. Pt reports pain 4-5/10 on pain scale only due to her not being used to having period like cramps and she changes her panty liner 4-5 times a day sometimes due to discomfort.  Pt advised that it will take approximately two hours for results in which we will call her with results and f/u.  Pt agreed.   Received STAT Beta Lab results of 63.  Notified Luna Kitchens, CNM who recom

## 2018-07-05 DIAGNOSIS — O039 Complete or unspecified spontaneous abortion without complication: Secondary | ICD-10-CM | POA: Insufficient documentation

## 2018-07-08 ENCOUNTER — Ambulatory Visit (HOSPITAL_COMMUNITY): Payer: Medicaid Other

## 2018-07-12 ENCOUNTER — Telehealth: Payer: Self-pay | Admitting: Family Medicine

## 2018-07-12 NOTE — Telephone Encounter (Signed)
Spoke with patient to get her scheduled for a f/u non-stat bhcg. Patient stated that she has had blood work since her 6/2 appointment.Patient stated that she got it done at [facility] and stated that everything came back negative.

## 2020-01-18 IMAGING — US OBSTETRIC <14 WK ULTRASOUND
1 series · 15 of 28 positions shown · non-contrast
Comparison: None.

CLINICAL DATA: Pregnant patient with vaginal bleeding.

EXAM:
OBSTETRIC <14 WK US AND TRANSVAGINAL OB US
TECHNIQUE: Both transabdominal and transvaginal ultrasound examinations were
performed for complete evaluation of the gestation as well as the
maternal uterus, adnexal regions, and pelvic cul-de-sac.
Transvaginal technique was performed to assess early pregnancy.

[Series 1: obstetric <14 wk ultrasound · 15 of 44 slices shown]
[im 1/44]
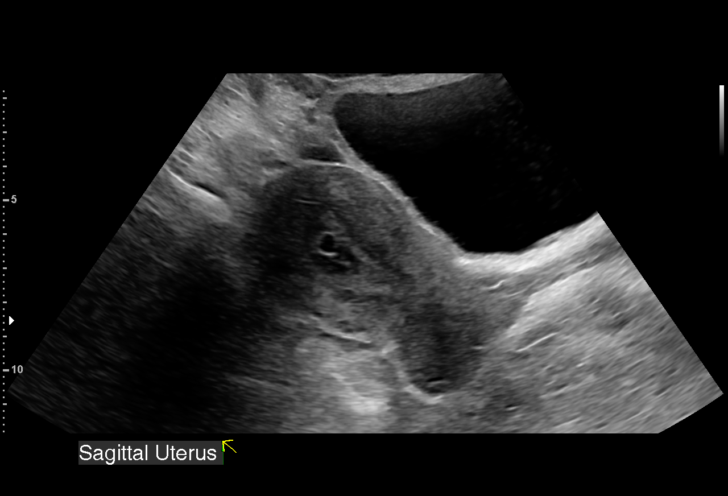
[im 4/44]
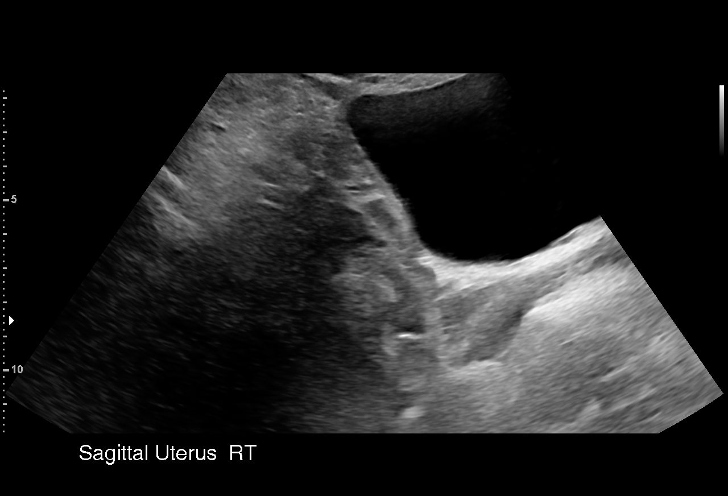
[im 7/44]
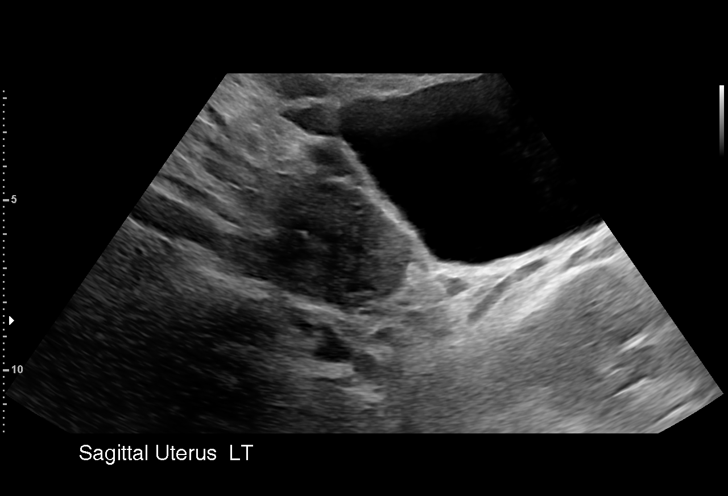
[im 10/44]
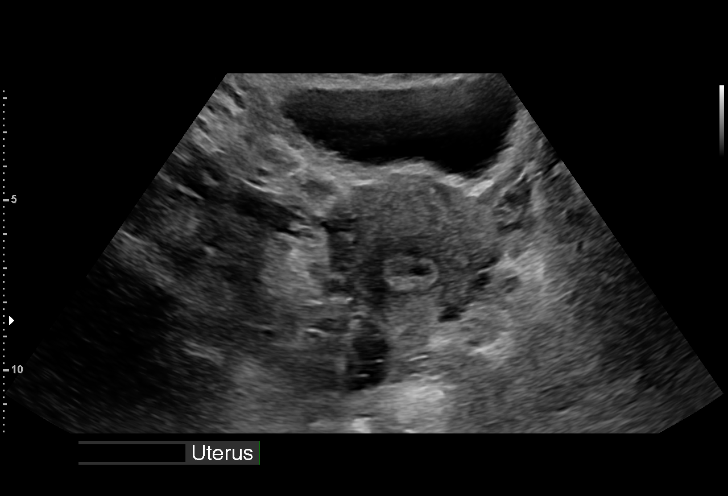
[im 13/44]
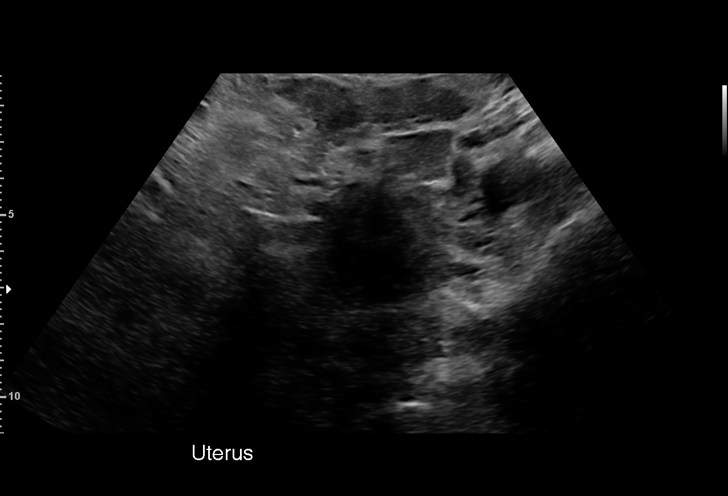
[im 16/44]
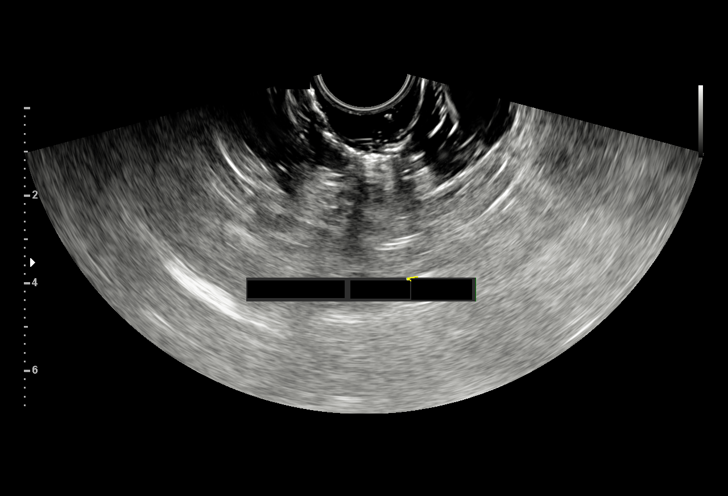
[im 20/44]
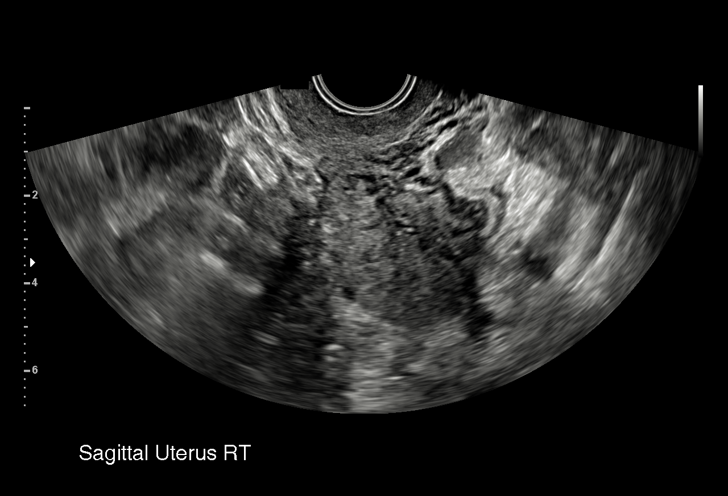
[im 23/44]
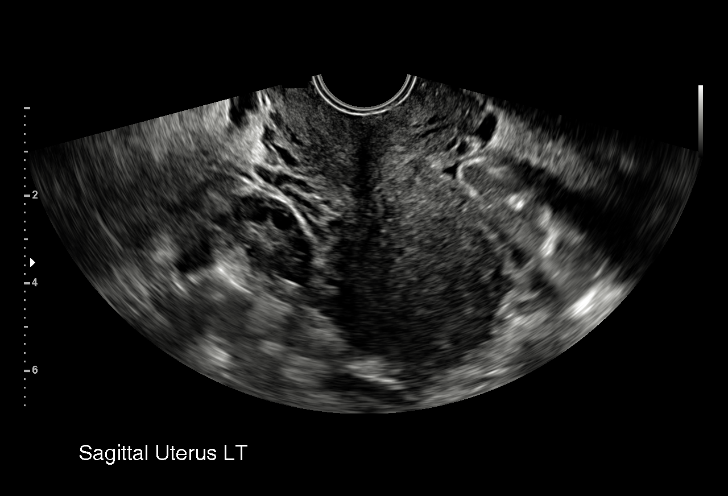
[im 24/44]
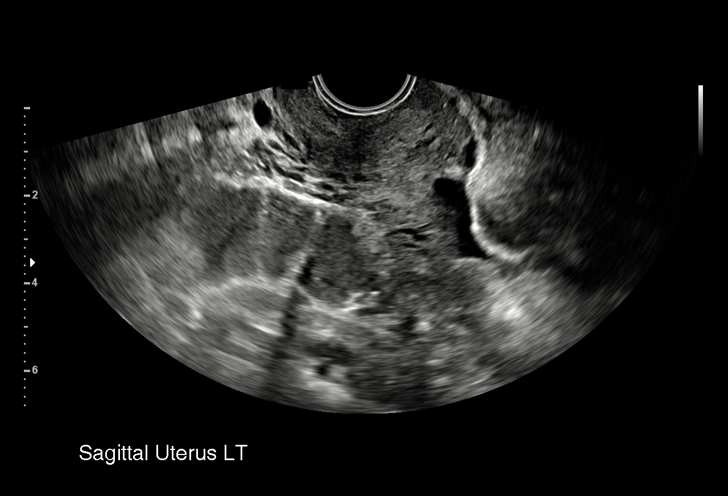
[im 28/44]
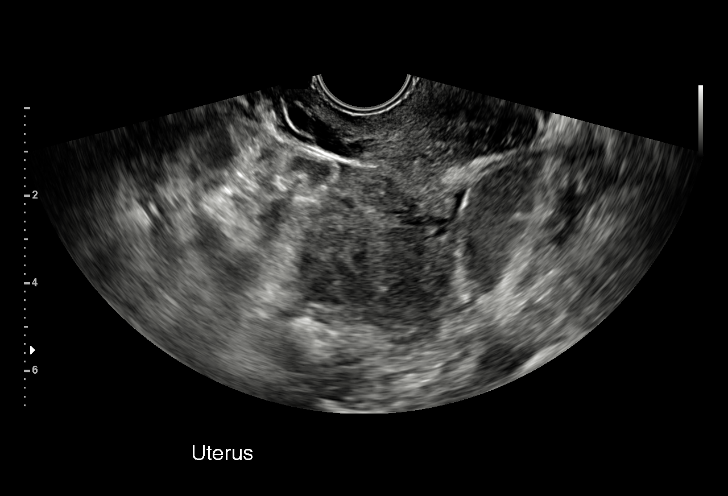
[im 31/44]
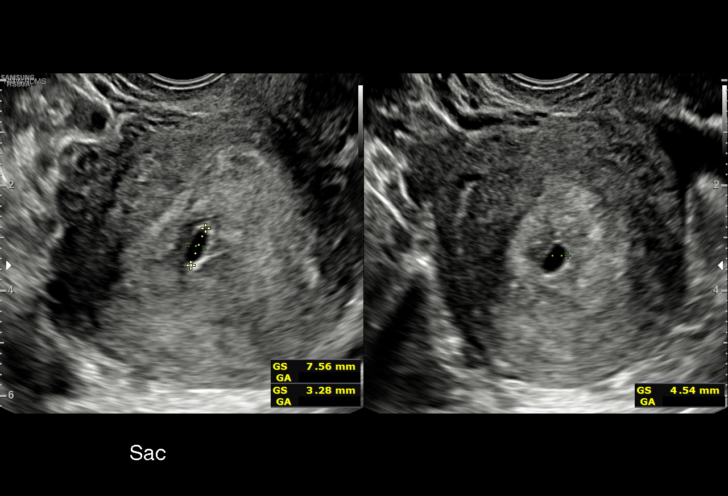
[im 34/44]
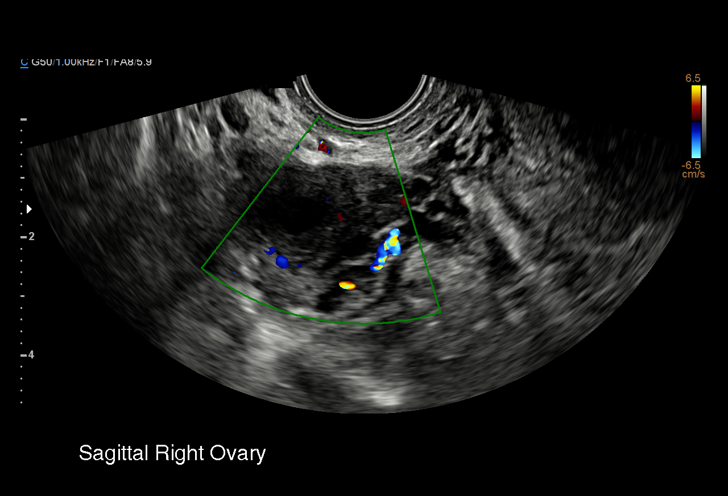
[im 37/44]
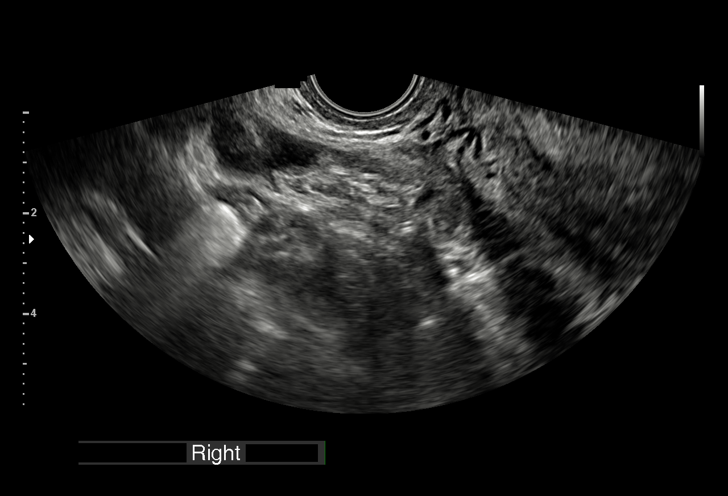
[im 40/44]
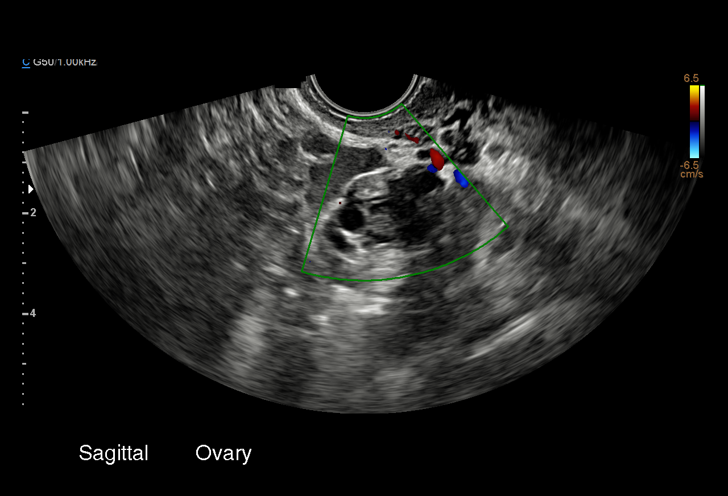
[im 44/44]
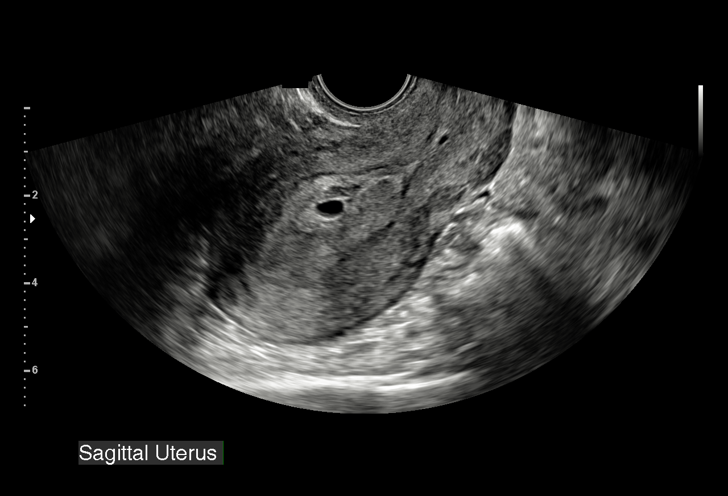

[15 of 28 positions shown; findings below may reference images not displayed]

FINDINGS: Intrauterine gestational sac: Single visualized.

Yolk sac:  Not visualized.

Embryo:  Not visualized.

Cardiac Activity: Not applicable.

MSD: 5.1 mm   5 w   2 d

Subchorionic hemorrhage:  None visualized.

Maternal uterus/adnexae: Unremarkable.
IMPRESSION: Probable early intrauterine gestational sac, but no yolk sac, fetal
pole, or cardiac activity yet visualized. Recommend follow-up
quantitative B-HCG levels and follow-up US in 14 days to assess
viability. This recommendation follows SRU consensus guidelines:
Diagnostic Criteria for Nonviable Pregnancy Early in the First
Trimester. N Engl J Med 4265; [DATE].
